# Patient Record
Sex: Male | Born: 1960 | Race: Black or African American | Hispanic: No | Marital: Single
Health system: Southern US, Community
[De-identification: ages and names within clinical notes are randomized; demographics above are authoritative.]

---

## 2005-02-22 ENCOUNTER — Ambulatory Visit: Payer: Self-pay | Admitting: Internal Medicine

## 2005-02-22 ENCOUNTER — Inpatient Hospital Stay (HOSPITAL_COMMUNITY): Admission: EM | Admit: 2005-02-22 | Discharge: 2005-02-26 | Payer: Self-pay | Admitting: Emergency Medicine

## 2005-02-23 ENCOUNTER — Ambulatory Visit: Payer: Self-pay | Admitting: Cardiology

## 2005-02-27 ENCOUNTER — Ambulatory Visit: Payer: Self-pay | Admitting: Internal Medicine

## 2005-02-28 ENCOUNTER — Ambulatory Visit: Payer: Self-pay | Admitting: *Deleted

## 2005-03-28 ENCOUNTER — Ambulatory Visit: Payer: Self-pay | Admitting: Internal Medicine

## 2005-04-09 ENCOUNTER — Ambulatory Visit: Payer: Self-pay | Admitting: Internal Medicine

## 2005-05-23 ENCOUNTER — Ambulatory Visit: Payer: Self-pay | Admitting: Internal Medicine

## 2005-09-15 IMAGING — CT CT HEAD W/O CM
1 series · 16 of 30 positions shown, 20 images · IV contrast (agent unspecified)
Comparison: none

CLINICAL DATA: Left-sided weakness.  
 HEAD CT WITHOUT CONTRAST:
 5 mm collimated images were obtained without contrast. There is premature atrophy with prominence of the ventricles, cisterns and sulci.  No areas of acute hemorrhage or infarction could be seen although there is mild motion artifact on a few images.   Scattered white matter lesions are seen bilaterally suggesting ischemic demyelination possibly from vasculitis or superimposed small vessel disease.  Bone windows reveal no sinus opacity or skull fracture.

[Series 2: brain · axial · 0.47mm/px · z∈[+143,+274]mm · 16 of 40 slices shown, 20 images]
[im 2/40  brain]
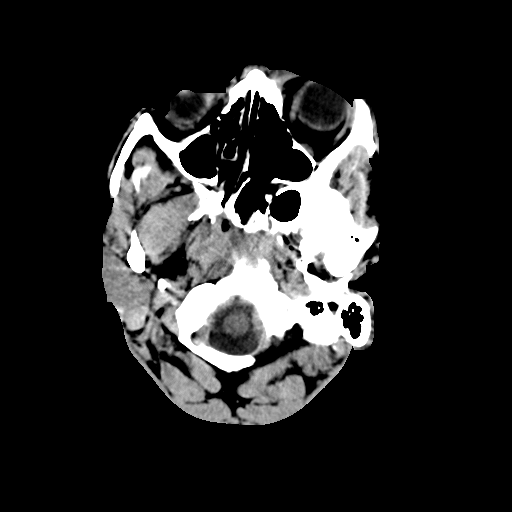
[im 2/40  bone]
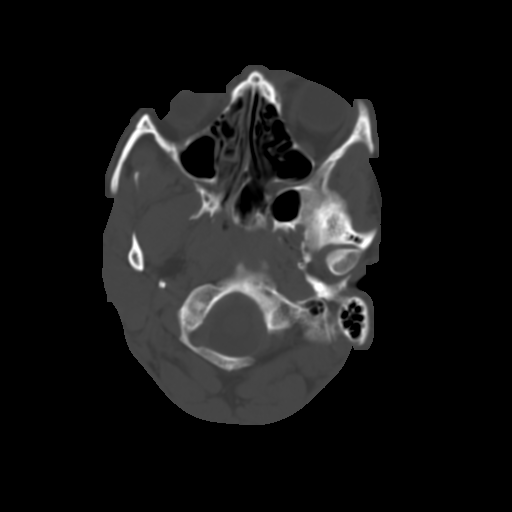
[im 5/40  brain]
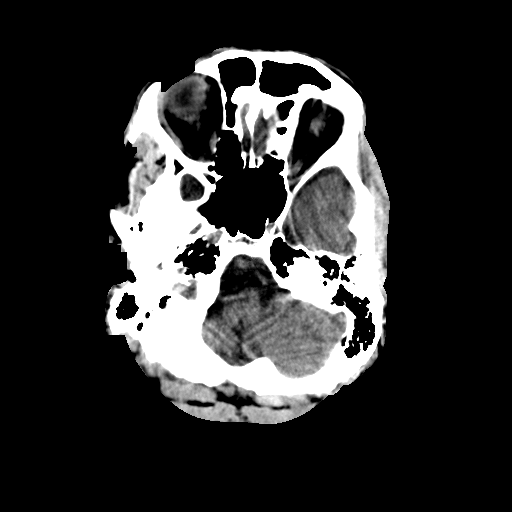
[im 7/40  brain]
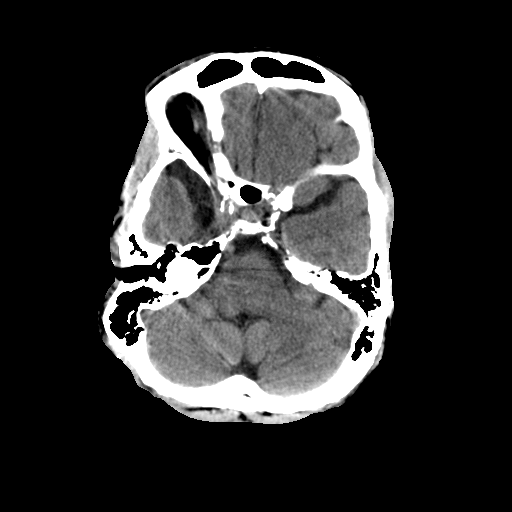
[im 10/40  brain]
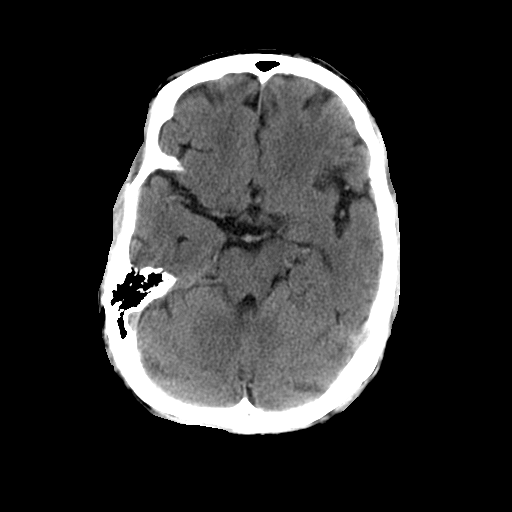
[im 11/40  brain]
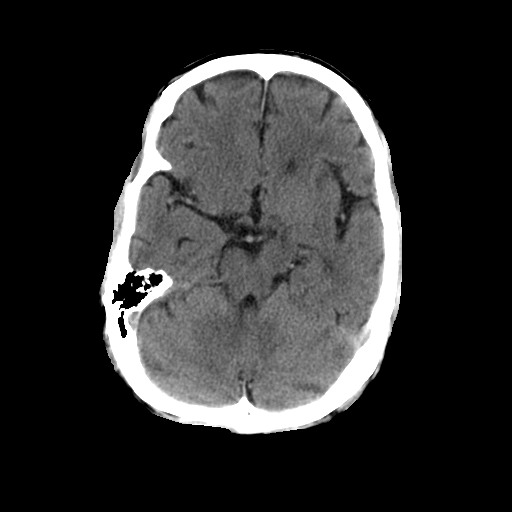
[im 11/40  bone]
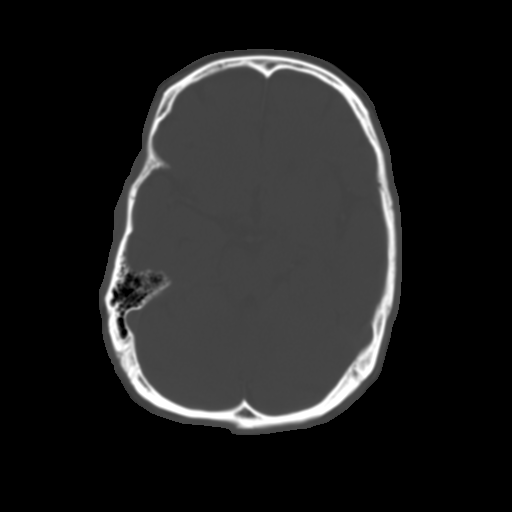
[im 14/40  brain]
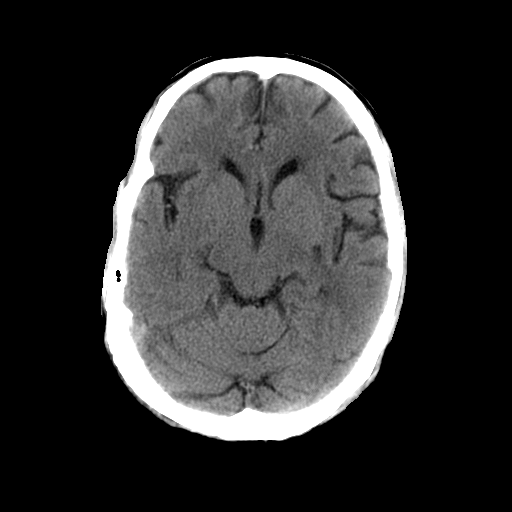
[im 17/40  brain]
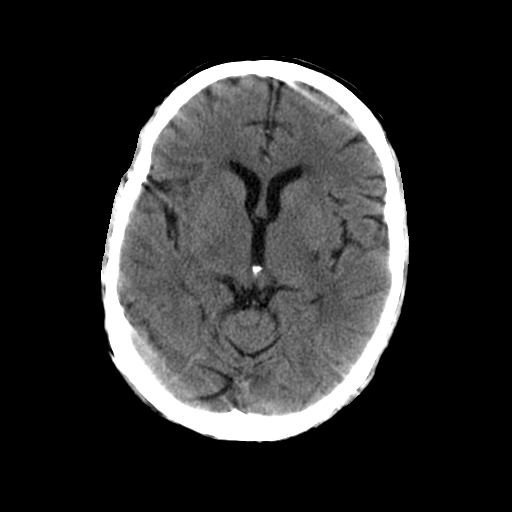
[im 19/40  brain]
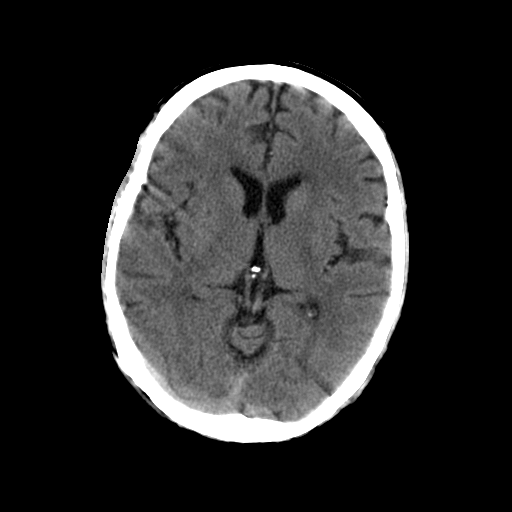
[im 21/40  brain]
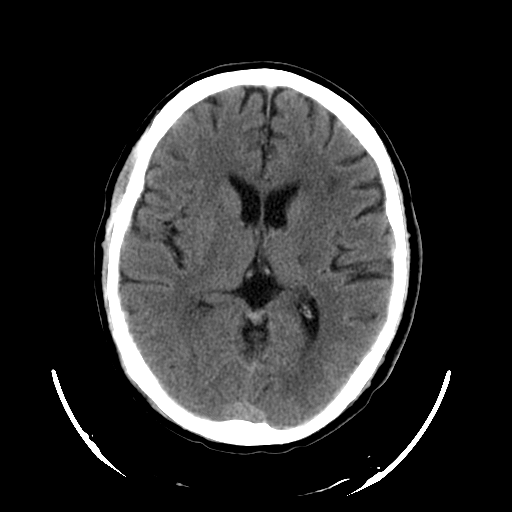
[im 21/40  bone]
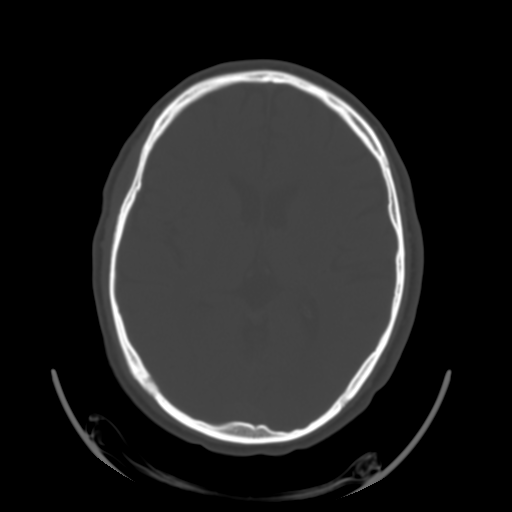
[im 23/40  brain]
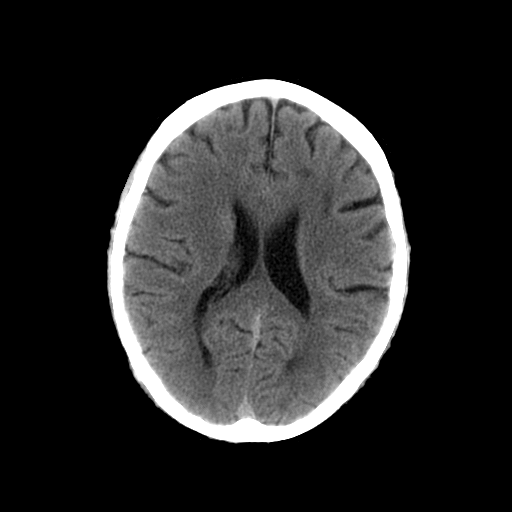
[im 26/40  brain]
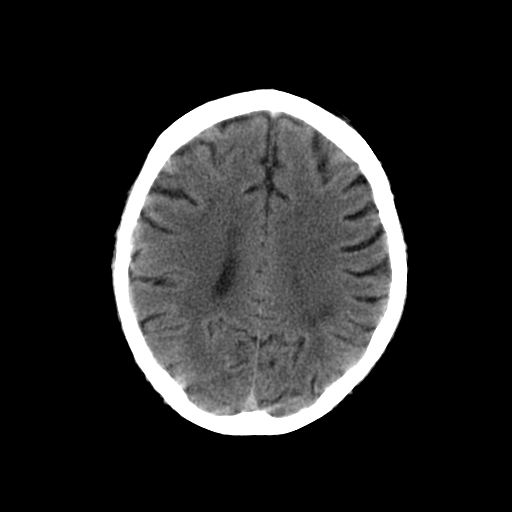
[im 29/40  brain]
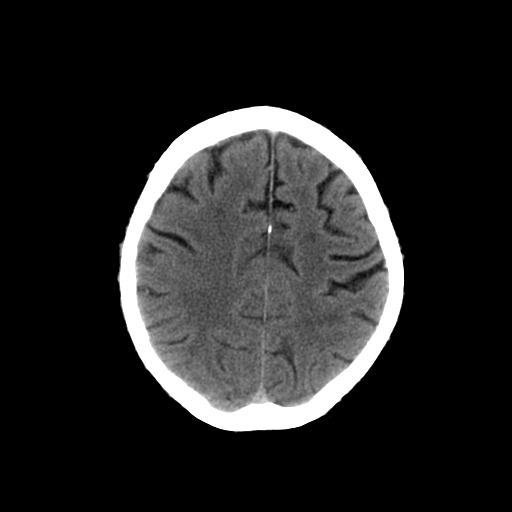
[im 30/40  brain]
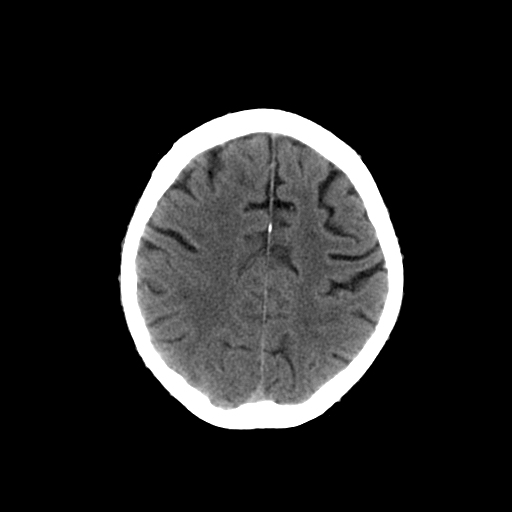
[im 30/40  bone]
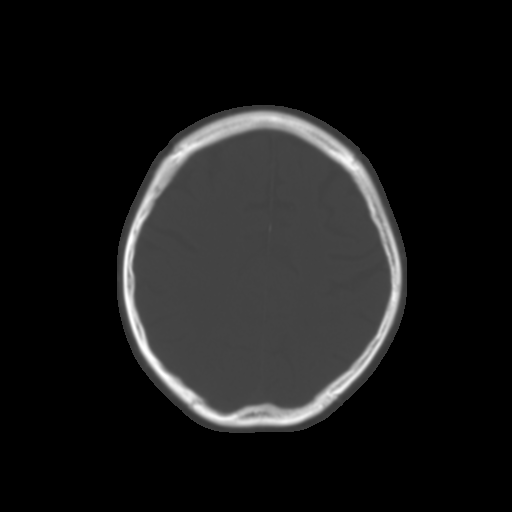
[im 33/40  brain]
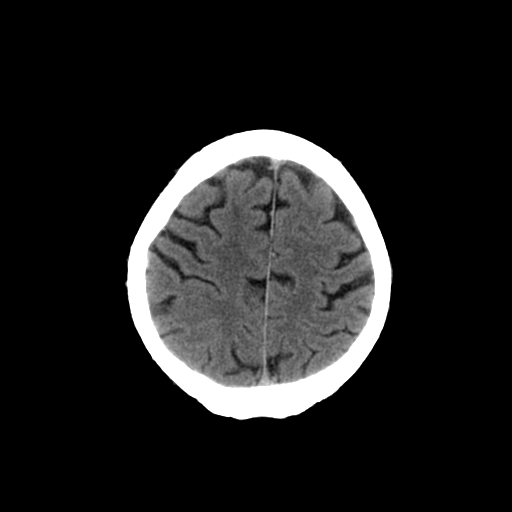
[im 35/40  brain]
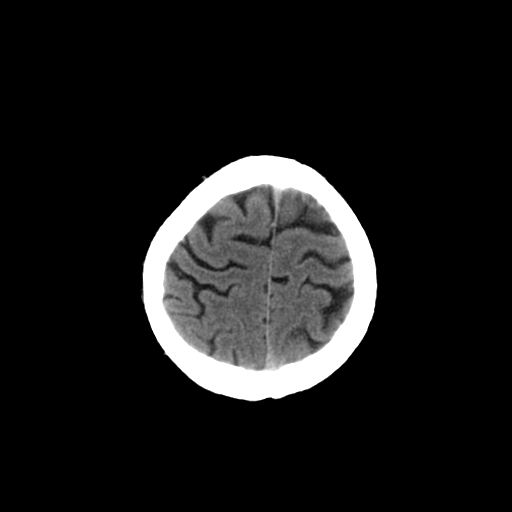
[im 38/40  brain]
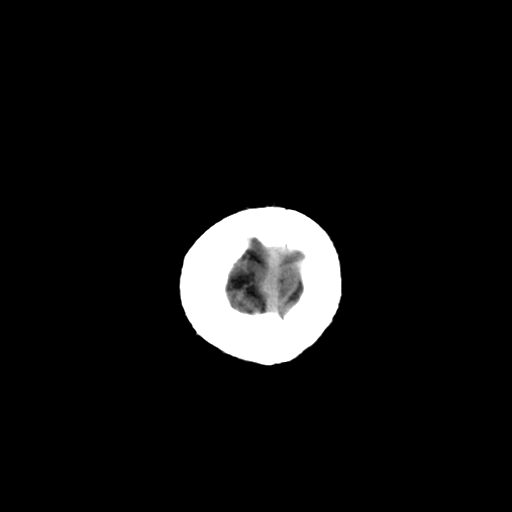

[16 of 30 positions shown; findings below may reference images not displayed]

IMPRESSION: 1.  Atrophy and small vessel disease. 
 2.  No acute stroke or hemorrhage.

## 2006-07-22 ENCOUNTER — Emergency Department (HOSPITAL_COMMUNITY): Admission: EM | Admit: 2006-07-22 | Discharge: 2006-07-22 | Payer: Self-pay | Admitting: Emergency Medicine

## 2021-08-04 ENCOUNTER — Encounter (HOSPITAL_COMMUNITY): Payer: Self-pay | Admitting: Emergency Medicine

## 2021-08-04 ENCOUNTER — Emergency Department (HOSPITAL_COMMUNITY)
Admission: EM | Admit: 2021-08-04 | Discharge: 2021-08-15 | Disposition: E | Payer: Self-pay | Attending: Emergency Medicine | Admitting: Emergency Medicine

## 2021-08-04 ENCOUNTER — Emergency Department (HOSPITAL_COMMUNITY): Payer: Self-pay

## 2021-08-04 DIAGNOSIS — I469 Cardiac arrest, cause unspecified: Secondary | ICD-10-CM

## 2021-08-04 LAB — CBC WITH DIFFERENTIAL/PLATELET
Abs Immature Granulocytes: 0.54 10*3/uL — ABNORMAL HIGH (ref 0.00–0.07)
Basophils Absolute: 0.1 10*3/uL (ref 0.0–0.1)
Basophils Relative: 0 %
Eosinophils Absolute: 0.1 10*3/uL (ref 0.0–0.5)
Eosinophils Relative: 0 %
HCT: 43.9 % (ref 39.0–52.0)
Hemoglobin: 13.7 g/dL (ref 13.0–17.0)
Immature Granulocytes: 5 %
Lymphocytes Relative: 51 %
Lymphs Abs: 5.9 10*3/uL — ABNORMAL HIGH (ref 0.7–4.0)
MCH: 30.6 pg (ref 26.0–34.0)
MCHC: 31.2 g/dL (ref 30.0–36.0)
MCV: 98 fL (ref 80.0–100.0)
Monocytes Absolute: 0.4 10*3/uL (ref 0.1–1.0)
Monocytes Relative: 3 %
Neutro Abs: 4.8 10*3/uL (ref 1.7–7.7)
Neutrophils Relative %: 41 %
Platelets: 145 10*3/uL — ABNORMAL LOW (ref 150–400)
RBC: 4.48 MIL/uL (ref 4.22–5.81)
RDW: 13.2 % (ref 11.5–15.5)
WBC: 11.7 10*3/uL — ABNORMAL HIGH (ref 4.0–10.5)
nRBC: 0.5 % — ABNORMAL HIGH (ref 0.0–0.2)

## 2021-08-04 LAB — I-STAT CHEM 8, ED
BUN: 13 mg/dL (ref 6–20)
Calcium, Ion: 0.94 mmol/L — ABNORMAL LOW (ref 1.15–1.40)
Chloride: 112 mmol/L — ABNORMAL HIGH (ref 98–111)
Creatinine, Ser: 1.3 mg/dL — ABNORMAL HIGH (ref 0.61–1.24)
Glucose, Bld: 206 mg/dL — ABNORMAL HIGH (ref 70–99)
HCT: 41 % (ref 39.0–52.0)
Hemoglobin: 13.9 g/dL (ref 13.0–17.0)
Potassium: 3 mmol/L — ABNORMAL LOW (ref 3.5–5.1)
Sodium: 143 mmol/L (ref 135–145)
TCO2: 18 mmol/L — ABNORMAL LOW (ref 22–32)

## 2021-08-04 LAB — COMPREHENSIVE METABOLIC PANEL
ALT: 83 U/L — ABNORMAL HIGH (ref 0–44)
AST: 98 U/L — ABNORMAL HIGH (ref 15–41)
Albumin: 2.6 g/dL — ABNORMAL LOW (ref 3.5–5.0)
Alkaline Phosphatase: 58 U/L (ref 38–126)
Anion gap: 15 (ref 5–15)
BUN: 12 mg/dL (ref 6–20)
CO2: 15 mmol/L — ABNORMAL LOW (ref 22–32)
Calcium: 7.7 mg/dL — ABNORMAL LOW (ref 8.9–10.3)
Chloride: 109 mmol/L (ref 98–111)
Creatinine, Ser: 1.49 mg/dL — ABNORMAL HIGH (ref 0.61–1.24)
GFR, Estimated: 54 mL/min — ABNORMAL LOW (ref >60)
Glucose, Bld: 216 mg/dL — ABNORMAL HIGH (ref 70–99)
Potassium: 3.1 mmol/L — ABNORMAL LOW (ref 3.5–5.1)
Sodium: 139 mmol/L (ref 135–145)
Total Bilirubin: 0.7 mg/dL (ref 0.3–1.2)
Total Protein: 4.8 g/dL — ABNORMAL LOW (ref 6.5–8.1)

## 2021-08-04 LAB — TROPONIN I (HIGH SENSITIVITY): Troponin I (High Sensitivity): 49 ng/L — ABNORMAL HIGH (ref <18)

## 2021-08-04 LAB — APTT: aPTT: 46 seconds — ABNORMAL HIGH (ref 24–36)

## 2021-08-04 LAB — PROTIME-INR
INR: 1.4 — ABNORMAL HIGH (ref 0.8–1.2)
Prothrombin Time: 17 seconds — ABNORMAL HIGH (ref 11.4–15.2)

## 2021-08-04 MED ORDER — NOREPINEPHRINE 4 MG/250ML-% IV SOLN
0.0000 ug/min | INTRAVENOUS | Status: DC
  Administered 2021-08-04: 10 ug/min via INTRAVENOUS

## 2021-08-04 MED ORDER — EPINEPHRINE 1 MG/10ML IJ SOSY
PREFILLED_SYRINGE | INTRAMUSCULAR | Status: AC | PRN
  Administered 2021-08-04 (×8): 1 mg via INTRAVENOUS

## 2021-08-04 MED ORDER — VASOPRESSIN 20 UNITS/100 ML INFUSION FOR SHOCK
0.0000 [IU]/min | INTRAVENOUS | Status: DC
  Administered 2021-08-04: 0.04 [IU]/min via INTRAVENOUS

## 2021-08-04 MED ORDER — EPINEPHRINE HCL 5 MG/250ML IV SOLN IN NS
0.5000 ug/min | INTRAVENOUS | Status: DC
  Administered 2021-08-04: 10 ug/min via INTRAVENOUS

## 2021-08-04 MED ORDER — ASPIRIN 81 MG PO CHEW: 324.0000 mg | CHEWABLE_TABLET | Freq: Once | ORAL | Status: DC

## 2021-08-04 MED ORDER — HEPARIN SODIUM (PORCINE) 5000 UNIT/ML IJ SOLN
4000.0000 [IU] | Freq: Once | INTRAMUSCULAR | Status: AC
  Administered 2021-08-04: 4000 [IU] via INTRAVENOUS

## 2021-08-04 MED ORDER — SODIUM CHLORIDE 0.9 % IV SOLN: INTRAVENOUS | Status: DC

## 2021-08-04 MED ORDER — SODIUM BICARBONATE 8.4 % IV SOLN
INTRAVENOUS | Status: AC | PRN
Start: 2021-08-04 — End: 2021-08-04
  Administered 2021-08-04: 50 meq via INTRAVENOUS

## 2021-08-15 NOTE — Code Documentation (Signed)
Cardiology at bedside.

## 2021-08-15 NOTE — ED Triage Notes (Signed)
Pt bib [facility]. Was found down in the parking for unknown amount of time. Pt rhythm was vfib with agonal breathing.EMS gave 10 of EPI, 450mg  of amnio, 1500cc of fluid. Bilat airway in place upon arrival, IO and 20g rt ac. EMS did of compression prior to arival.

## 2021-08-15 NOTE — Code Documentation (Signed)
Code STEMI activation

## 2021-08-15 NOTE — Code Documentation (Signed)
Patient time of death occurred at 60.

## 2021-08-15 NOTE — ED Provider Notes (Signed)
[facility] EMERGENCY DEPARTMENT Provider Note   CSN: [id number] Arrival date & time: Aug 19, 2021  1735     History Chief Complaint  Patient presents with   Cardiac Arrest    Nathan Valentine is a 60 y.o. male.  HPI This is a 59 year old male with no known past medical history presents via EMS with CPR in progress.  Patient was found down by a bystander and found to be in V. fib arrest by EMS.  Patient was shocked 4 times, given epi x5, and amiodarone.  30 minutes of chest compressions prior to arrival without ROSC.  Unknown time down prior to being found by bystander.  Unknown past medical history.    History reviewed. No pertinent past medical history.  There are no problems to display for this patient.   History reviewed. No pertinent surgical history.     History reviewed. No pertinent family history.     Home Medications Prior to Admission medications   Not on File    Allergies    Patient has no allergy information on record.  Review of Systems   Review of Systems  Unable to perform ROS: Intubated   Physical Exam Updated Vital Signs BP (!) 76/49   Pulse (!) 127   Resp (!) 0   Ht 5\' 8"  (1.727 m)   Wt 59 kg   SpO2 (!) 74%   BMI 19.77 kg/m   Physical Exam Vitals and nursing note reviewed.  Constitutional:      Appearance: He is well-developed.  HENT:     Head: Normocephalic and atraumatic.  Eyes:     Conjunctiva/sclera: Conjunctivae normal.     Comments: Pupils 5 mm minimally responsive.   Cardiovascular:     Rate and Rhythm: Normal rate and regular rhythm.     Heart sounds: No murmur heard. Pulmonary:     Comments: Intubated and mechanically ventilated.  Has breath sounds bilaterally. Abdominal:     Palpations: Abdomen is soft.     Tenderness: There is no abdominal tenderness.  Musculoskeletal:     Cervical back: Neck supple.  Skin:    General: Skin is warm and dry.  Neurological:     GCS: GCS eye subscore is 1. GCS verbal  subscore is 1. GCS motor subscore is 1.     Comments: Does not withdrawal from deep painful stimulation     ED Results / Procedures / Treatments   Labs (all labs ordered are listed, but only abnormal results are displayed) Labs Reviewed  CBC WITH DIFFERENTIAL/PLATELET - Abnormal; Notable for the following components:      Result Value   WBC 11.7 (*)    Platelets 145 (*)    nRBC 0.5 (*)    Lymphs Abs 5.9 (*)    Abs Immature Granulocytes 0.54 (*)    All other components within normal limits  PROTIME-INR - Abnormal; Notable for the following components:   Prothrombin Time 17.0 (*)    INR 1.4 (*)    All other components within normal limits  APTT - Abnormal; Notable for the following components:   aPTT 46 (*)    All other components within normal limits  COMPREHENSIVE METABOLIC PANEL - Abnormal; Notable for the following components:   Potassium 3.1 (*)    CO2 15 (*)    Glucose, Bld 216 (*)    Creatinine, Ser 1.49 (*)    Calcium 7.7 (*)    Total Protein 4.8 (*)    Albumin 2.6 (*)  AST 98 (*)    ALT 83 (*)    GFR, Estimated 54 (*)    All other components within normal limits  I-STAT CHEM 8, ED - Abnormal; Notable for the following components:   Potassium 3.0 (*)    Chloride 112 (*)    Creatinine, Ser 1.30 (*)    Glucose, Bld 206 (*)    Calcium, Ion 0.94 (*)    TCO2 18 (*)    All other components within normal limits  TROPONIN I (HIGH SENSITIVITY) - Abnormal; Notable for the following components:   Troponin I (High Sensitivity) 49 (*)    All other components within normal limits  RESP PANEL BY RT-PCR (FLU A&B, COVID) ARPGX2  HEMOGLOBIN A1C  LIPID PANEL  TROPONIN I (HIGH SENSITIVITY)    EKG None  Radiology DG Chest Port 1 View  Result Date: 2021/08/22 CLINICAL DATA:  Post CPR. EXAM: PORTABLE CHEST 1 VIEW COMPARISON:  Remote radiograph 02/23/2005 FINDINGS: Endotracheal tube tip is 2.6 cm from the carina. There are bilateral interstitial opacities in the perihilar  and upper lobe predominant distribution suspicious for pulmonary edema. Possibility of aspiration is also considered. Heart size upper normal. No pneumothorax or large pleural effusion. Gaseous gastric distension is seen in the upper abdomen. No acute osseous abnormalities. IMPRESSION: 1. Endotracheal tube tip 2.6 cm from the carina. 2. Bilateral interstitial opacities in the perihilar and upper lobe predominant distribution suspicious for pulmonary edema. Possibility of aspiration is also considered. 3. Gaseous gastric distention in the upper abdomen. Electronically Signed   By: Narda Rutherford M.D.   On: Aug 22, 2021 19:03    Procedures Procedures   Medications Ordered in ED Medications  0.9 %  sodium chloride infusion (has no administration in time range)  aspirin chewable tablet 324 mg (324 mg Oral Not Given 08/22/2021 1830)  vasopressin (PITRESSIN) 20 Units in sodium chloride 0.9 % 100 mL infusion-*FOR SHOCK* (0 Units/min Intravenous Stopped 08-22-2021 1827)  EPINEPHrine (ADRENALIN) 5 mg in NS 250 mL (0.02 mg/mL) premix infusion (0 mcg/min Intravenous Stopped 2021/08/22 1827)  norepinephrine (LEVOPHED) 4mg  in premix infusion (0 mcg/min Intravenous Stopped Aug 22, 2021 1827)  heparin injection 4,000 Units (4,000 Units Intravenous Given 08-22-21 1810)  EPINEPHrine (ADRENALIN) 1 MG/10ML injection (1 mg Intravenous Given 2021-08-22 1821)  sodium bicarbonate injection (50 mEq Intravenous Given 08/22/2021 1815)    ED Course  I have reviewed the triage vital signs and the nursing notes.  Pertinent labs & imaging results that were available during my care of the patient were reviewed by me and considered in my medical decision making (see chart for details).    MDM Rules/Calculators/A&P                          On arrival CPR is in progress. Airway confirmed to be in tact with bilateral breath sounds and O2 sat >95 percent. Pulse check reveals PEA arrest. Compressions resumed and epi givenx2. ROSC  obtained however BP is low with MAP about 50. Patient bradycardic with HR 40-50. Initiated on epi gtt. EKG with ST elevations in precordial leads. No clear reciprocal depressions. However given patient found down in Vfib, concern for ischemic cause of collapse is high. Code STEMI called.  No pericardial effusion. Has lung sliding anteriorly and CXR without pneumothorax.  No hypothermia. Istat with mild hypokalemia but does not explain arrest. Bicarb low at 18. Hgb normal. No evidence of traumatic injuries. No clear toxidromes.   Patient then became bradycardic  to 30s and lost pulses again. Compressions resumed. Epi push given and gtt continues at 15 mcg/min. ROSC obtained. Bicarb given. Levophed iniated but patient remains hypotensive so vasopressin ordered. Pulse barely palpable but bedside echo revealed organized cardiac activity. Cardiology at bedside. Patient codes again and receives push of epix1 and compressions resumed.   Cardiology unable to take patient to cath lab given his hypotension and he continues to intermittently code. No other clear causes of arrest that are able to be intervened on.   In total patient had 30 minutes of compressions from EMS and has had intermittent compressions while in Ed for an hour. Unknown time down prior to discovery. Pupils are 87mm and minimally reactive. Patient does not respond to painful stimuli. Does not require any sedation with mechanical ventilation. Does have some agonal respirations but otherwise no signs of life. Compressions stopped and time of death called at 1827. No pulse palpated. No respiratory effort.     Final Clinical Impression(s) / ED Diagnoses Final diagnoses:  Cardiac arrest [facility])    Rx / DC Orders ED Discharge Orders     None        Doran Clay, MD 08/05/21 1245    Alvira Monday, MD 08/07/21 1825

## 2021-08-15 NOTE — Code Documentation (Signed)
No pulse palpated. Time of death called by Dalene Seltzer MD

## 2021-08-15 NOTE — Code Documentation (Signed)
Pulses not palpated, CPR started

## 2021-08-15 NOTE — Consult Note (Signed)
  60 y/o male with unknown PMHx. Reportedly homeless.   Was found down in the parking for unknown amount of time. Upon EMS arrival, rhythm was vfib with agonal breathing. Defibrillated in the field with no effect. Intubated. EMS gave 10 of EPI, 450mg  of amio, 1500cc of fluid. On arrival to the ER ongoing CPR > 30 mins.   Received another 45 mins of CPR in the ER and then ROSC obtained on epi, NE and VP. BP unobtainable.   STEMI and shock consults placed due to ? of ST elevation on ECG and possible need for ECMO.   Rhythm on my arrival junctional bradycardia.   Pupils fixed and dilated.   I placed echo probe with minimal cardiac activity.   After discussions with STEMI and ER team, I made the decision to call the Code due to futility.   CCT 40 mins.   , MD  6:47 PM

## 2021-08-15 NOTE — Code Documentation (Signed)
CPR started.

## 2021-08-15 NOTE — Code Documentation (Addendum)
PEA arrest, pt became bradycardic. CPR started

## 2021-08-15 NOTE — Code Documentation (Signed)
CPR started, no pulse

## 2021-08-15 NOTE — ED Notes (Signed)
Pt placement called, aware  post mortem checklist is completed.

## 2021-08-15 DEATH — deceased

## 2022-10-01 IMAGING — DX DG CHEST 1V PORT
1 series · 2 of 2 positions shown · non-contrast
Comparison: Remote radiograph 02/23/2005

CLINICAL DATA: Post CPR.

EXAM:
PORTABLE CHEST 1 VIEW

[Series 1: chest · 0.14mm/px · 2 of 2 slices shown]
[im 1/2]
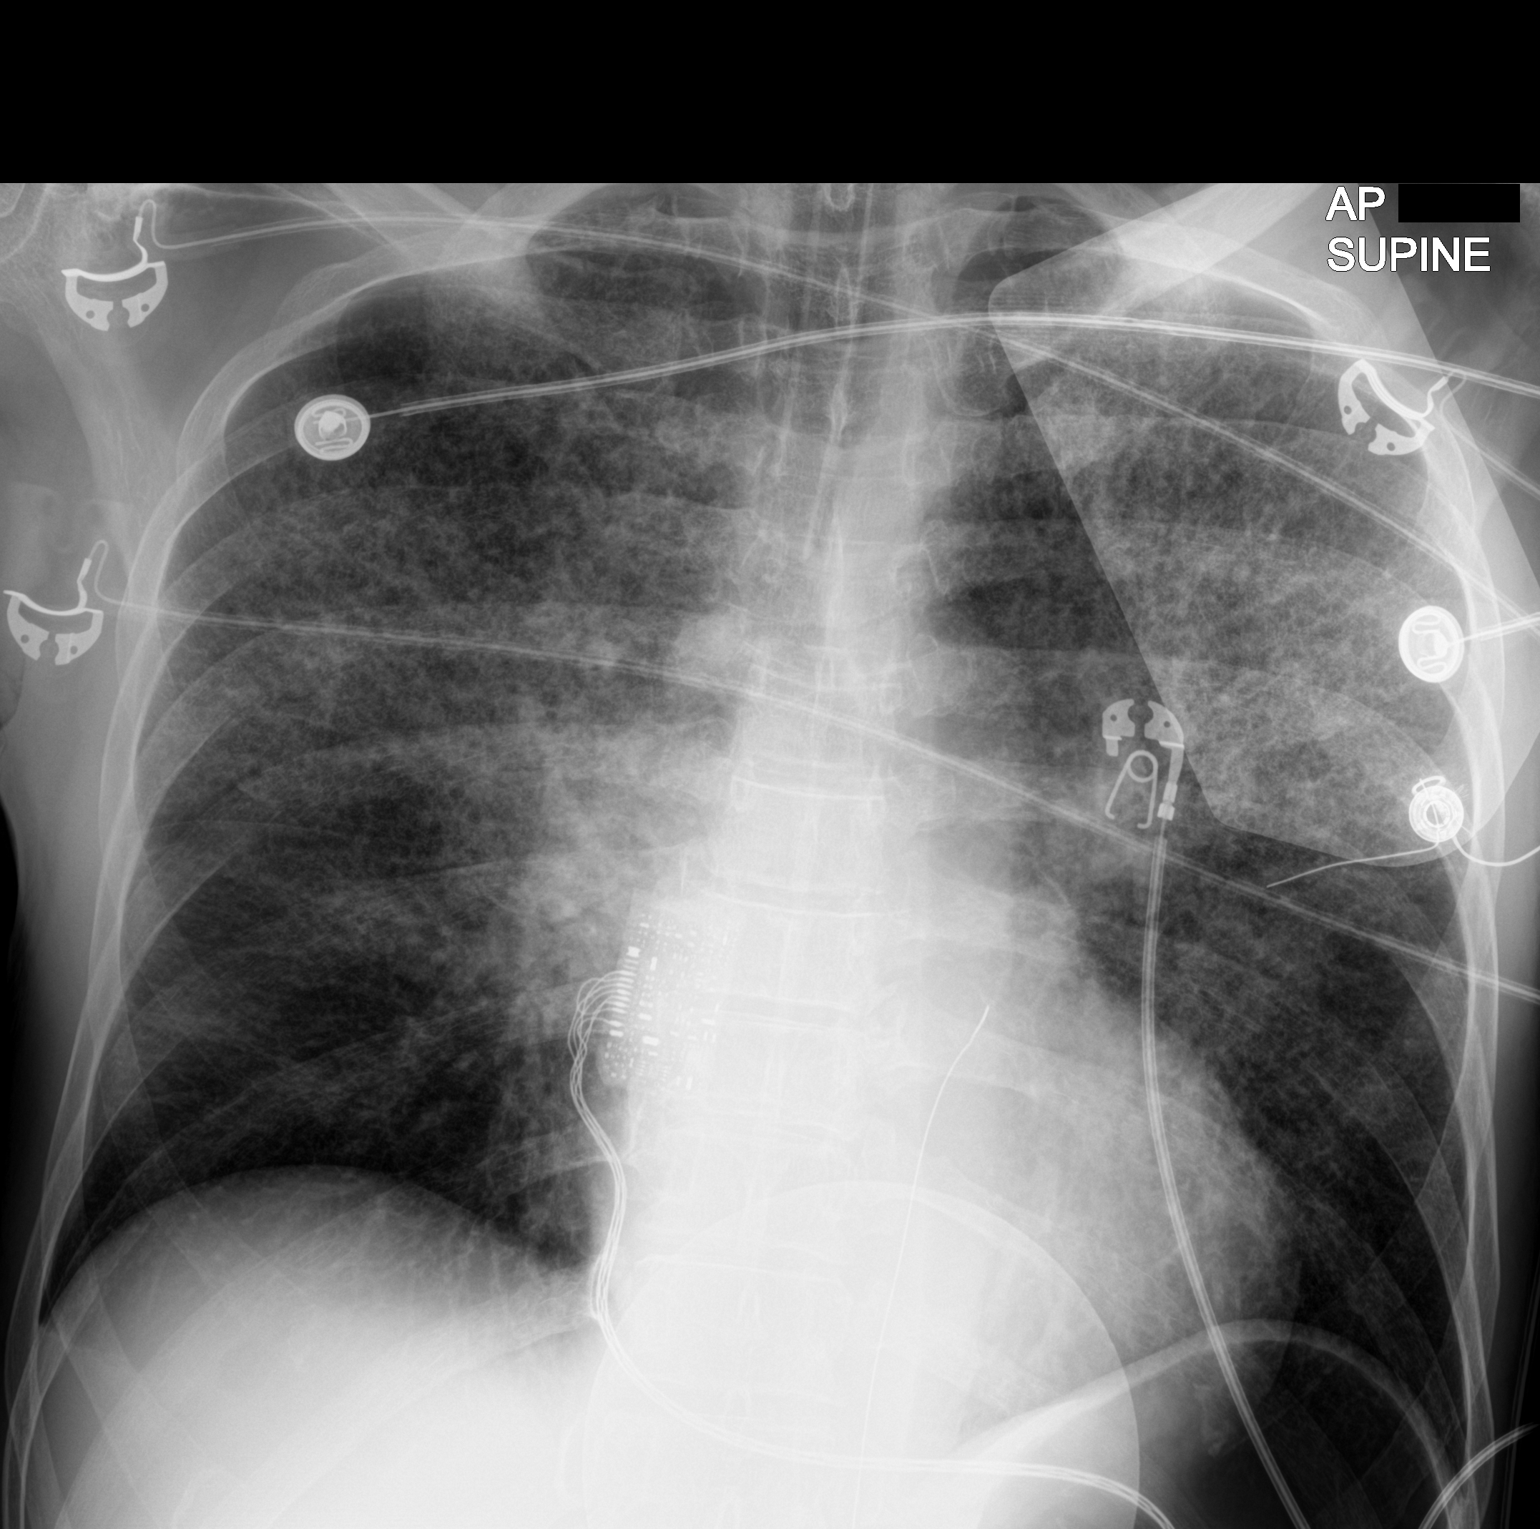
[im 2/2]
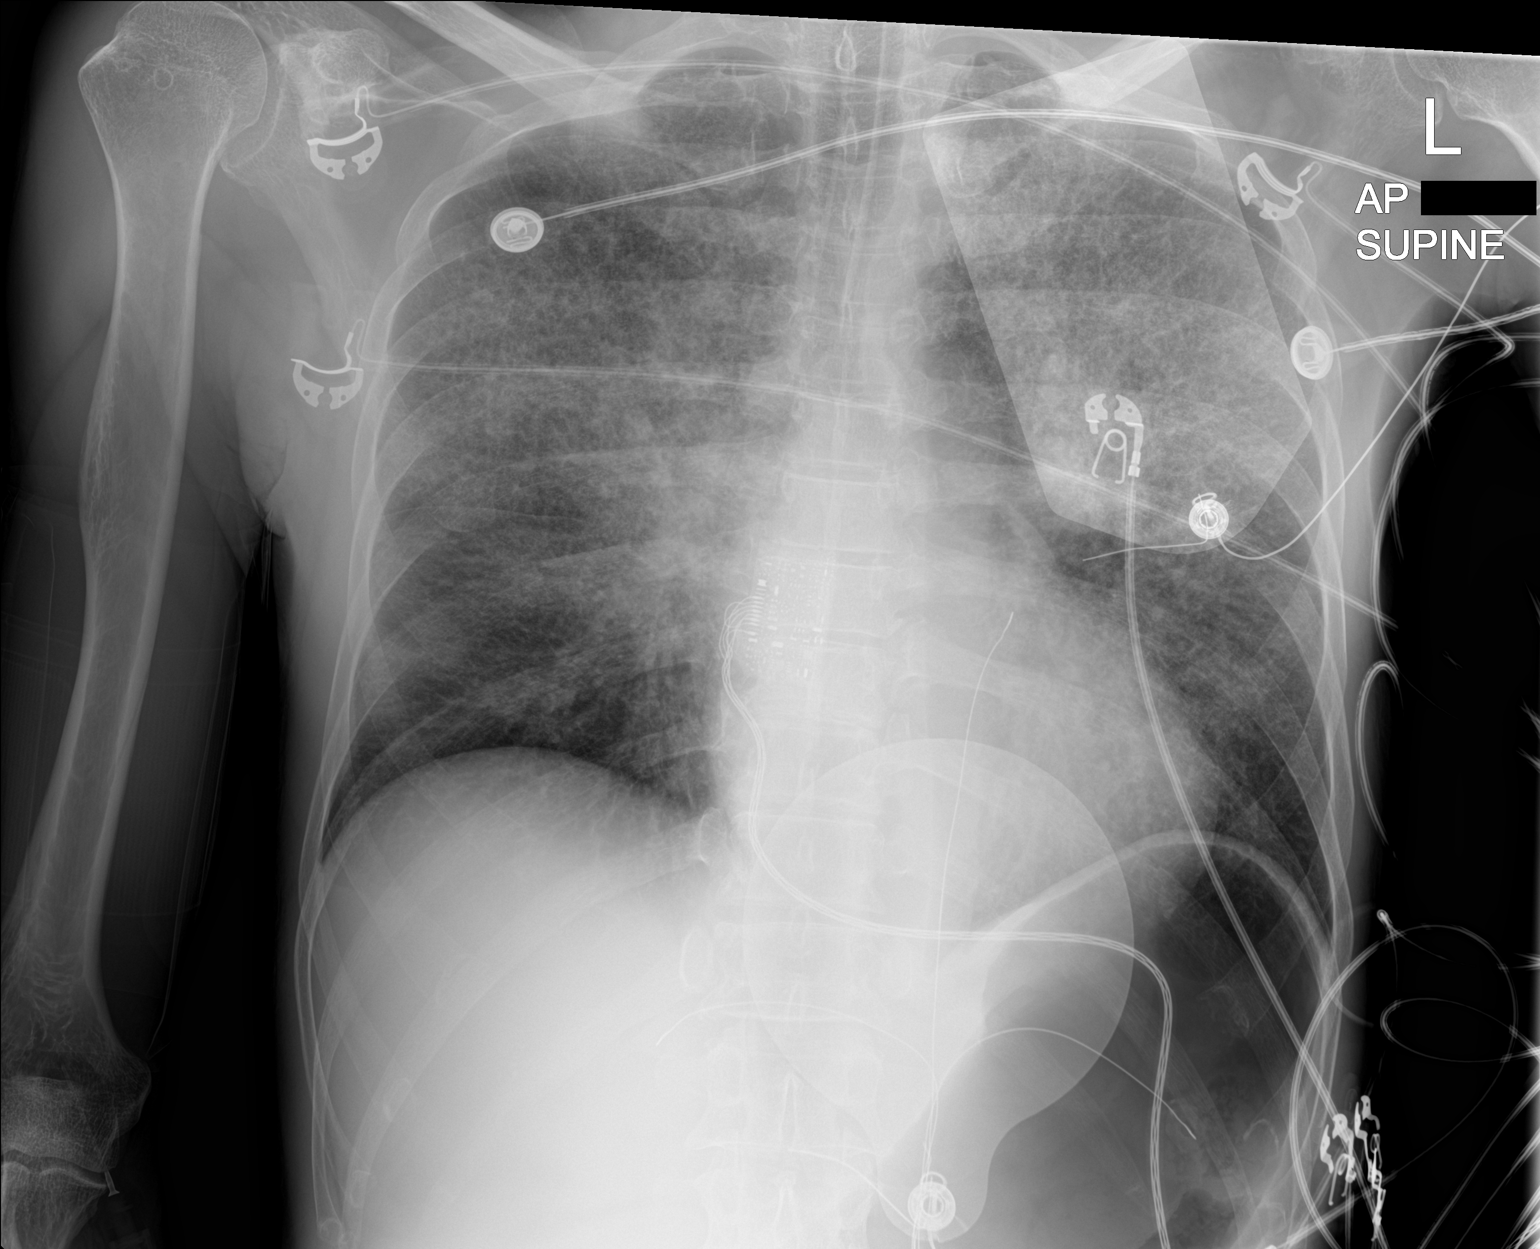

[2 of 2 positions shown; findings below may reference images not displayed]

FINDINGS: Endotracheal tube tip is 2.6 cm from the carina. There are bilateral
interstitial opacities in the perihilar and upper lobe predominant
distribution suspicious for pulmonary edema. Possibility of
aspiration is also considered. Heart size upper normal. No
pneumothorax or large pleural effusion. Gaseous gastric distension
is seen in the upper abdomen. No acute osseous abnormalities.
IMPRESSION: 1. Endotracheal tube tip 2.6 cm from the carina.
2. Bilateral interstitial opacities in the perihilar and upper lobe
predominant distribution suspicious for pulmonary edema. Possibility
of aspiration is also considered.
3. Gaseous gastric distention in the upper abdomen.
